# Patient Record
Sex: Male | Born: 1970 | Race: Black or African American | Hispanic: No | Marital: Married | Smoking: Never smoker
Health system: Southern US, Community
[De-identification: ages and names within clinical notes are randomized; demographics above are authoritative.]

---

## 1999-10-06 ENCOUNTER — Emergency Department (HOSPITAL_COMMUNITY): Admission: EM | Admit: 1999-10-06 | Discharge: 1999-10-06 | Payer: Self-pay | Admitting: Emergency Medicine

## 2004-12-23 ENCOUNTER — Ambulatory Visit (HOSPITAL_COMMUNITY): Admission: RE | Admit: 2004-12-23 | Discharge: 2004-12-23 | Payer: Self-pay | Admitting: Sports Medicine

## 2008-04-11 ENCOUNTER — Emergency Department (HOSPITAL_COMMUNITY): Admission: EM | Admit: 2008-04-11 | Discharge: 2008-04-11 | Payer: Self-pay | Admitting: Emergency Medicine

## 2011-01-03 ENCOUNTER — Emergency Department (HOSPITAL_COMMUNITY)
Admission: EM | Admit: 2011-01-03 | Discharge: 2011-01-03 | Disposition: A | Discharge status: Home / Self Care | Payer: Self-pay | Attending: Emergency Medicine | Admitting: Emergency Medicine

## 2011-01-03 DIAGNOSIS — M542 Cervicalgia: Secondary | ICD-10-CM | POA: Insufficient documentation

## 2011-01-03 DIAGNOSIS — M62838 Other muscle spasm: Secondary | ICD-10-CM | POA: Insufficient documentation

## 2011-01-03 DIAGNOSIS — R079 Chest pain, unspecified: Secondary | ICD-10-CM | POA: Insufficient documentation

## 2014-08-29 ENCOUNTER — Ambulatory Visit (INDEPENDENT_AMBULATORY_CARE_PROVIDER_SITE_OTHER): Payer: BLUE CROSS/BLUE SHIELD | Admitting: Family Medicine

## 2014-08-29 VITALS — BP 110/70 | HR 61 | Temp 97.5°F | Resp 17 | Ht 67.5 in | Wt 181.4 lb

## 2014-08-29 DIAGNOSIS — N63 Unspecified lump in unspecified breast: Secondary | ICD-10-CM

## 2014-08-29 NOTE — Patient Instructions (Signed)
We will set up an ultrasound or mammogram for your chest area- we will be in touch with this appointment

## 2014-08-29 NOTE — Progress Notes (Signed)
[facility] [address], [place] [address] [phone]  Date:  08/29/2014   Name:  John Flores   DOB:  03/13/1971   MRN:  [id number]  PCP:  No primary care provider on file.    Chief Complaint: Mass   History of Present Illness:  John KonigGibrilla R Bun is a 44 y.o. very pleasant male patient who presents with the following:  He has noted a fullness or lump in the left breast for the last 5-6 weeks. He does not have any pain, but got concerned about this and wanted to have it looked at.  He is generally in excellent health. Does not take an rx medications.  He is not aware of anything that could have caused this change such as supplements or trauma.    There are no active problems to display for this patient.   History reviewed. No pertinent past medical history.  History reviewed. No pertinent past surgical history.  History  Substance Use Topics  . Smoking status: Never Smoker   . Smokeless tobacco: Not on file  . Alcohol Use: Not on file    History reviewed. No pertinent family history.  Not on File  Medication list has been reviewed and updated.  No current outpatient prescriptions on file prior to visit.   No current facility-administered medications on file prior to visit.    Review of Systems:  As per HPI- otherwise negative.   Physical Examination: Filed Vitals:   08/29/14 1152  BP: 104/58  Pulse: 61  Temp: 97.5 F (36.4 C)  Resp: 17   Filed Vitals:   08/29/14 1152  Height: 5' 7.5" (1.715 m)  Weight: 181 lb 6.4 oz (82.283 kg)   Body mass index is 27.98 kg/(m^2). Ideal Body Weight: Weight in (lb) to have BMI = 25: 161.7  GEN: WDWN, NAD, Non-toxic, A & O x 3, looks well and healthy HEENT: Atraumatic, Normocephalic. Neck supple. No masses, No LAD. Ears and Nose: No external deformity. CV: RRR, No M/G/R. No JVD. No thrill. No extra heart sounds. PULM: CTA B, no wheezes, crackles, rhonchi. No retractions. No resp.  distress. No accessory muscle use. ABD: S, NT, ND. No rebound. No HSM.  EXTR: No c/c/e NEURO Normal gait.  PSYCH: Normally interactive. Conversant. Not depressed or anxious appearing.  Calm demeanor.  Well appearing. Well- muscled chest.  He does have a fuller left chest/ breast than on the right. This fullness seems to be centered under the nipple. There is no hard mass or nodule, no cyst- like structures  Assessment and Plan: Breast mass in male - Plan: MM DIAG BREAST TOMO BILATERAL, US BREAST LTD UNI LEFT INC AXILLA  Ordered mammogram and US in left breast to evaluate further.    Signed Abbe AmsterdamJessica Treasure Ingrum, MD

## 2014-09-25 ENCOUNTER — Ambulatory Visit
Admission: RE | Admit: 2014-09-25 | Discharge: 2014-09-25 | Disposition: A | Discharge status: Home / Self Care | Payer: BLUE CROSS/BLUE SHIELD | Source: Ambulatory Visit | Attending: Family Medicine | Admitting: Family Medicine

## 2014-09-25 DIAGNOSIS — N63 Unspecified lump in unspecified breast: Secondary | ICD-10-CM

## 2014-10-07 ENCOUNTER — Encounter: Payer: Self-pay | Admitting: Family Medicine

## 2014-10-07 ENCOUNTER — Telehealth: Payer: Self-pay | Admitting: Family Medicine

## 2014-10-07 NOTE — Telephone Encounter (Signed)
Called and LMOM- US showed gynecomastia.  Please come and see us for further eval/ labs soon

## 2014-10-09 ENCOUNTER — Ambulatory Visit: Payer: BLUE CROSS/BLUE SHIELD | Admitting: Family Medicine

## 2014-10-16 ENCOUNTER — Ambulatory Visit: Payer: BLUE CROSS/BLUE SHIELD | Admitting: Family Medicine

## 2019-09-19 ENCOUNTER — Ambulatory Visit: Payer: Self-pay | Attending: Internal Medicine

## 2019-09-19 DIAGNOSIS — Z23 Encounter for immunization: Secondary | ICD-10-CM

## 2019-09-19 NOTE — Progress Notes (Signed)
   Covid-19 Vaccination Clinic  Name:  John Flores    MRN: [id number] DOB: 07-01-1970  09/19/2019  [name] was observed post Covid-19 immunization for 15 minutes without incident. He was provided with Vaccine Information Sheet and instruction to access the V-Safe system.   [name] was instructed to call 911 with any severe reactions post vaccine: Marland Kitchen Difficulty breathing  . Swelling of face and throat  . A fast heartbeat  . A bad rash all over body  . Dizziness and weakness   Immunizations Administered    Name Date Dose VIS Date Route   Pfizer COVID-19 Vaccine 09/19/2019 10:38 AM 0.3 mL 05/24/2019 Intramuscular   Manufacturer: [employer], [employer]   Lot: [id number]   NDC: [address]-1

## 2019-10-14 ENCOUNTER — Ambulatory Visit: Payer: Self-pay | Attending: Internal Medicine

## 2020-11-03 ENCOUNTER — Other Ambulatory Visit: Payer: Self-pay

## 2020-11-03 ENCOUNTER — Emergency Department (HOSPITAL_COMMUNITY)
Admission: EM | Admit: 2020-11-03 | Discharge: 2020-11-03 | Disposition: A | Discharge status: Home / Self Care | Payer: PRIVATE HEALTH INSURANCE | Attending: Emergency Medicine | Admitting: Emergency Medicine

## 2020-11-03 ENCOUNTER — Encounter (HOSPITAL_COMMUNITY): Payer: Self-pay

## 2020-11-03 ENCOUNTER — Emergency Department (HOSPITAL_COMMUNITY): Payer: PRIVATE HEALTH INSURANCE

## 2020-11-03 DIAGNOSIS — N179 Acute kidney failure, unspecified: Secondary | ICD-10-CM | POA: Diagnosis not present

## 2020-11-03 DIAGNOSIS — R42 Dizziness and giddiness: Secondary | ICD-10-CM

## 2020-11-03 DIAGNOSIS — I10 Essential (primary) hypertension: Secondary | ICD-10-CM

## 2020-11-03 DIAGNOSIS — N289 Disorder of kidney and ureter, unspecified: Secondary | ICD-10-CM

## 2020-11-03 LAB — BASIC METABOLIC PANEL
Anion gap: 7 (ref 5–15)
BUN: 12 mg/dL (ref 6–20)
CO2: 27 mmol/L (ref 22–32)
Calcium: 9.2 mg/dL (ref 8.9–10.3)
Chloride: 106 mmol/L (ref 98–111)
Creatinine, Ser: 1.43 mg/dL — ABNORMAL HIGH (ref 0.61–1.24)
GFR, Estimated: 60 mL/min — ABNORMAL LOW (ref >60)
Glucose, Bld: 93 mg/dL (ref 70–99)
Potassium: 4.2 mmol/L (ref 3.5–5.1)
Sodium: 140 mmol/L (ref 135–145)

## 2020-11-03 LAB — CBC
HCT: 44.1 % (ref 39.0–52.0)
Hemoglobin: 14.4 g/dL (ref 13.0–17.0)
MCH: 29.1 pg (ref 26.0–34.0)
MCHC: 32.7 g/dL (ref 30.0–36.0)
MCV: 89.1 fL (ref 80.0–100.0)
Platelets: 240 10*3/uL (ref 150–400)
RBC: 4.95 MIL/uL (ref 4.22–5.81)
RDW: 13 % (ref 11.5–15.5)
WBC: 5.8 10*3/uL (ref 4.0–10.5)
nRBC: 0 % (ref 0.0–0.2)

## 2020-11-03 LAB — TSH: TSH: 1.595 u[IU]/mL (ref 0.350–4.500)

## 2020-11-03 MED ORDER — AMLODIPINE BESYLATE 5 MG PO TABS: 5.0000 mg | ORAL_TABLET | Freq: Every day | ORAL | 0 refills | Status: AC

## 2020-11-03 NOTE — ED Triage Notes (Signed)
Patient c/o dizziness when he walks or standing up quickly x 2 days.

## 2020-11-03 NOTE — ED Notes (Signed)
Patient's wife at bedside.

## 2020-11-03 NOTE — ED Provider Notes (Signed)
[facility]-EMERGENCY DEPT Provider Note   CSN: [id number] Arrival date & time: 11/03/20  0818     History Chief Complaint  Patient presents with  . Dizziness    John Flores is a 50 y.o. male.  HPI Patient presents with dizziness.  States he has had for the last 2 or 3 days.  States there is some symptoms started Wednesday but worse around 2 or 3 days ago.  States it is worse when he stands up.  States he feels unsteady and has to walk a little more slow.  Has had headache.  Frontal headache.  This is not unusual for him.  States has had dizziness like this before but has not seen anyone for it.  No relief with Tylenol.  No localizing numbness or weakness.  States sometimes his vision gets a little blurry with it also.  States blood pressure was elevated at home.  States it was 190 systolic.  No known hypertension history.  No chest pain.  No trouble breathing.    History reviewed. No pertinent past medical history.  There are no problems to display for this patient.   History reviewed. No pertinent surgical history.     History reviewed. No pertinent family history.  Social History   Tobacco Use  . Smoking status: Never Smoker  . Smokeless tobacco: Never Used  Vaping Use  . Vaping Use: Never used  Substance Use Topics  . Alcohol use: Never    Alcohol/week: 0.0 standard drinks  . Drug use: Never    Home Medications Prior to Admission medications   Medication Sig Start Date End Date Taking? Authorizing Provider  amLODipine (NORVASC) 5 MG tablet Take 1 tablet (5 mg total) by mouth daily. 11/03/20  Yes Benjiman Core, MD  acetaminophen (TYLENOL) 325 MG tablet Take 325 mg by mouth as needed.    [provider]    Allergies    Patient has no known allergies.  Review of Systems   Review of Systems  Constitutional: Negative for appetite change, fatigue and fever.  HENT: Negative for congestion.   Eyes: Positive for visual  disturbance.  Respiratory: Negative for shortness of breath.   Gastrointestinal: Negative for abdominal distention.  Genitourinary: Negative for flank pain.  Musculoskeletal: Positive for gait problem.  Skin: Negative for rash.  Neurological: Positive for dizziness and headaches.  Psychiatric/Behavioral: Negative for confusion.    Physical Exam Updated Vital Signs BP (!) 136/100   Pulse 63   Temp 98 F (36.7 C) (Oral)   Resp 16   Ht 5\' 9"  (1.753 m)   Wt 88.5 kg   SpO2 100%   BMI 28.80 kg/m   Physical Exam Vitals reviewed.  Constitutional:      Appearance: Normal appearance.  HENT:     Head: Atraumatic.     Nose: Nose normal.     Mouth/Throat:     Mouth: Mucous membranes are moist.  Eyes:     Extraocular Movements: Extraocular movements intact.     Pupils: Pupils are equal, round, and reactive to light.     Comments: No nystagmus  Cardiovascular:     Rate and Rhythm: Normal rate and regular rhythm.  Pulmonary:     Effort: Pulmonary effort is normal.     Breath sounds: No wheezing or rhonchi.  Abdominal:     Tenderness: There is no abdominal tenderness.  Musculoskeletal:        General: No tenderness.     Cervical back:  Neck supple.  Skin:    General: Skin is warm.     Capillary Refill: Capillary refill takes less than 2 seconds.  Neurological:     Mental Status: He is alert and oriented to person, place, and time.     Comments: No nystagmus.  Eye movements intact.  Finger-nose intact bilaterally.  Normal gait.  No Romberg.     ED Results / Procedures / Treatments   Labs (all labs ordered are listed, but only abnormal results are displayed) Labs Reviewed  BASIC METABOLIC PANEL - Abnormal; Notable for the following components:      Result Value   Creatinine, Ser 1.43 (*)    GFR, Estimated 60 (*)    All other components within normal limits  CBC  TSH    EKG EKG Interpretation  Date/Time:  Tuesday Nov 03 2020 08:43:50 EDT Ventricular Rate:  66 PR  Interval:  158 QRS Duration: 88 QT Interval:  406 QTC Calculation: 426 R Axis:   71 Text Interpretation: Sinus rhythm No significant change since last tracing Confirmed by Benjiman Core [phone]) on 11/03/2020 9:13:52 AM   Radiology CT Head Wo Contrast  Result Date: 11/03/2020 CLINICAL DATA:  Acute worsening headache, dizziness EXAM: CT HEAD WITHOUT CONTRAST TECHNIQUE: Contiguous axial images were obtained from the base of the skull through the vertex without intravenous contrast. COMPARISON:  None. FINDINGS: Brain: No evidence of acute infarction, hemorrhage, hydrocephalus, extra-axial collection or mass lesion/mass effect. Vascular: No hyperdense vessel or unexpected calcification. Skull: Normal. Negative for fracture or focal lesion. Sinuses/Orbits: No acute finding. Other: None. IMPRESSION: Normal head CT without contrast for age Electronically Signed   By: Judie Petit.  Shick M.D.   On: 11/03/2020 10:19    Procedures Procedures   Medications Ordered in ED Medications - No data to display  ED Course  I have reviewed the triage vital signs and the nursing notes.  Pertinent labs & imaging results that were available during my care of the patient were reviewed by me and considered in my medical decision making (see chart for details).    MDM Rules/Calculators/A&P                          Patient presents with dizziness and hypertension.  States he feels a little bit off.  States may be a little unsteady at times.  States he has been checking his blood pressure and has been running high.  Has a mild headache when this happens.  States he feels more dizzy when the blood pressure goes up.  He will go back to normal in between.  Worse when he switches positions.  Worse when he stands up.  Benign exam.  Nonfocal.  No nystagmus or Romberg.  Normal gait.  Blood pressure somewhat improved.  Has a history of likely hypertension and feels of but benefit from treatment.  Will give antihypertensives.  Will  need PCP to follow-up with.  Doubt acute cause of dizziness such as stroke.  Creatinine mildly elevated.  Unsure baseline but blood pressure improved and will start antihypertensives. Final Clinical Impression(s) / ED Diagnoses Final diagnoses:  Dizziness  Hypertension, unspecified type  Kidney disease    Rx / DC Orders ED Discharge Orders         Ordered    amLODipine (NORVASC) 5 MG tablet  Daily        11/03/20 1128           Benjiman Core, MD 11/03/20  1652  

## 2020-11-03 NOTE — Discharge Instructions (Signed)
Your kidney function is mildly off with a creatinine of 1.4.  Follow-up with a primary care doctor.  You have been seen previously by [facility] and their information is listed.  We are starting you on some blood pressure medicine

## 2020-11-03 NOTE — ED Notes (Signed)
ED provider at the bedside to evaluate.  

## 2020-11-03 NOTE — ED Notes (Signed)
Provider back at the bedside to evaluate.

## 2021-04-22 ENCOUNTER — Other Ambulatory Visit (HOSPITAL_COMMUNITY): Payer: Self-pay

## 2021-04-22 MED ORDER — AMLODIPINE BESYLATE 10 MG PO TABS
ORAL_TABLET | ORAL | 3 refills | Status: DC
  Filled 2021-04-22 – 2021-05-08 (×2): qty 90, 90d supply, fill #0
  Filled 2021-10-12: qty 90, 90d supply, fill #1

## 2021-05-01 ENCOUNTER — Other Ambulatory Visit (HOSPITAL_COMMUNITY): Payer: Self-pay

## 2021-05-08 ENCOUNTER — Other Ambulatory Visit (HOSPITAL_COMMUNITY): Payer: Self-pay

## 2021-10-13 ENCOUNTER — Other Ambulatory Visit (HOSPITAL_COMMUNITY): Payer: Self-pay

## 2022-02-01 ENCOUNTER — Other Ambulatory Visit (HOSPITAL_COMMUNITY): Payer: Self-pay

## 2022-02-01 MED ORDER — CYCLOBENZAPRINE HCL 5 MG PO TABS
5.0000 mg | ORAL_TABLET | Freq: Three times a day (TID) | ORAL | 0 refills | Status: AC | PRN
  Filled 2022-02-01: qty 30, 10d supply, fill #0

## 2022-06-16 ENCOUNTER — Other Ambulatory Visit (HOSPITAL_COMMUNITY): Payer: Self-pay

## 2022-06-17 ENCOUNTER — Other Ambulatory Visit (HOSPITAL_COMMUNITY): Payer: Self-pay

## 2022-06-17 MED ORDER — AMLODIPINE BESYLATE 10 MG PO TABS
10.0000 mg | ORAL_TABLET | Freq: Every day | ORAL | 3 refills | Status: AC
  Filled 2022-06-17: qty 90, 90d supply, fill #0
  Filled 2022-09-20: qty 30, 30d supply, fill #1
  Filled 2022-10-17: qty 30, 30d supply, fill #2
  Filled 2022-11-29: qty 30, 30d supply, fill #3
  Filled 2023-01-03: qty 30, 30d supply, fill #4
  Filled 2023-01-28: qty 30, 30d supply, fill #5
  Filled 2023-02-27 – 2023-03-11 (×2): qty 30, 30d supply, fill #6
  Filled 2023-04-12 – 2023-05-06 (×3): qty 30, 30d supply, fill #7

## 2022-07-21 DIAGNOSIS — R7309 Other abnormal glucose: Secondary | ICD-10-CM | POA: Diagnosis not present

## 2022-07-21 DIAGNOSIS — Z125 Encounter for screening for malignant neoplasm of prostate: Secondary | ICD-10-CM | POA: Diagnosis not present

## 2022-07-21 DIAGNOSIS — R946 Abnormal results of thyroid function studies: Secondary | ICD-10-CM | POA: Diagnosis not present

## 2022-07-21 DIAGNOSIS — R7989 Other specified abnormal findings of blood chemistry: Secondary | ICD-10-CM | POA: Diagnosis not present

## 2022-07-21 DIAGNOSIS — Z79899 Other long term (current) drug therapy: Secondary | ICD-10-CM | POA: Diagnosis not present

## 2022-07-28 ENCOUNTER — Other Ambulatory Visit (HOSPITAL_COMMUNITY): Payer: Self-pay

## 2022-07-28 DIAGNOSIS — R7303 Prediabetes: Secondary | ICD-10-CM | POA: Diagnosis not present

## 2022-07-28 DIAGNOSIS — I10 Essential (primary) hypertension: Secondary | ICD-10-CM | POA: Diagnosis not present

## 2022-07-28 DIAGNOSIS — Z0000 Encounter for general adult medical examination without abnormal findings: Secondary | ICD-10-CM | POA: Diagnosis not present

## 2022-07-28 DIAGNOSIS — E7800 Pure hypercholesterolemia, unspecified: Secondary | ICD-10-CM | POA: Diagnosis not present

## 2022-07-28 MED ORDER — ROSUVASTATIN CALCIUM 5 MG PO TABS
5.0000 mg | ORAL_TABLET | Freq: Every day | ORAL | 1 refills | Status: DC
  Filled 2022-07-28: qty 30, 30d supply, fill #0
  Filled 2022-08-24: qty 30, 30d supply, fill #1
  Filled 2022-09-26: qty 30, 30d supply, fill #2
  Filled 2022-10-30: qty 30, 30d supply, fill #3
  Filled 2022-11-29: qty 30, 30d supply, fill #4
  Filled 2023-01-03: qty 30, 30d supply, fill #5

## 2022-07-28 MED ORDER — AMLODIPINE BESYLATE 10 MG PO TABS
10.0000 mg | ORAL_TABLET | Freq: Every day | ORAL | 1 refills | Status: DC
  Filled 2022-07-28 – 2023-07-11 (×2): qty 30, 30d supply, fill #0

## 2022-09-12 DIAGNOSIS — E7800 Pure hypercholesterolemia, unspecified: Secondary | ICD-10-CM | POA: Diagnosis not present

## 2022-09-20 ENCOUNTER — Other Ambulatory Visit: Payer: Self-pay

## 2022-10-16 IMAGING — CT CT HEAD W/O CM
3 series · 14 of 45 positions shown, 16 images · non-contrast
Comparison: None.

CLINICAL DATA: Acute worsening headache, dizziness

EXAM:
CT HEAD WITHOUT CONTRAST
TECHNIQUE: Contiguous axial images were obtained from the base of the skull
through the vertex without intravenous contrast.

[Series 2: head wo · axial · 0.47mm/px · z∈[-71,+44]mm · 8 of 28 slices shown, 10 images]
[im 3/28  brain]
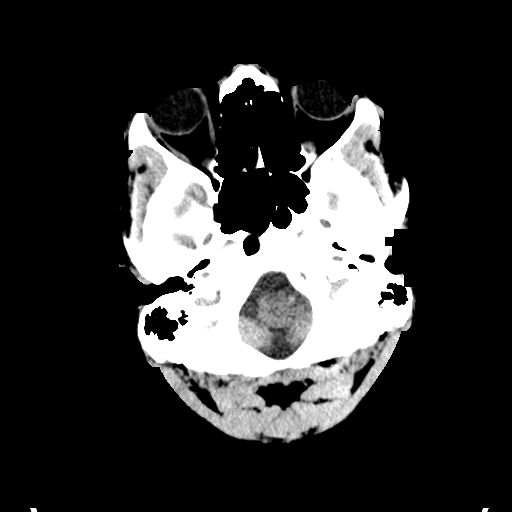
[im 3/28  bone]
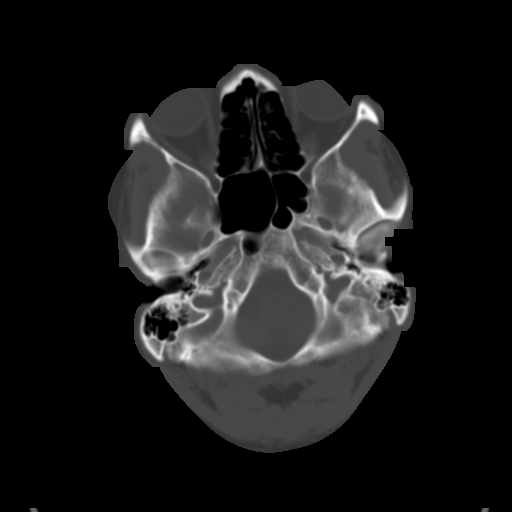
[im 6/28  brain]
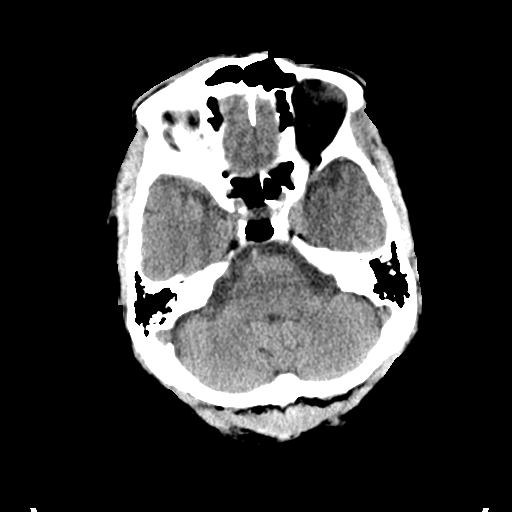
[im 10/28  brain]
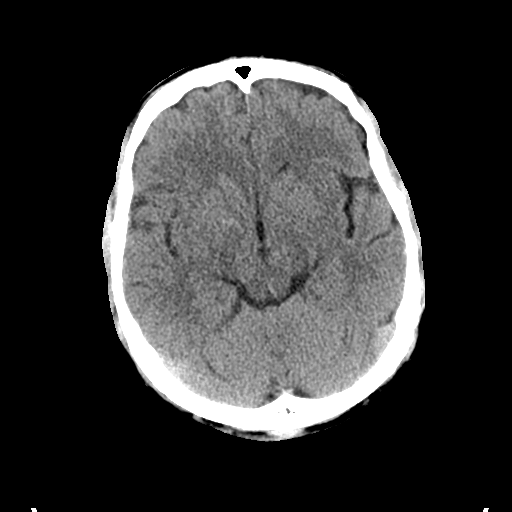
[im 13/28  brain]
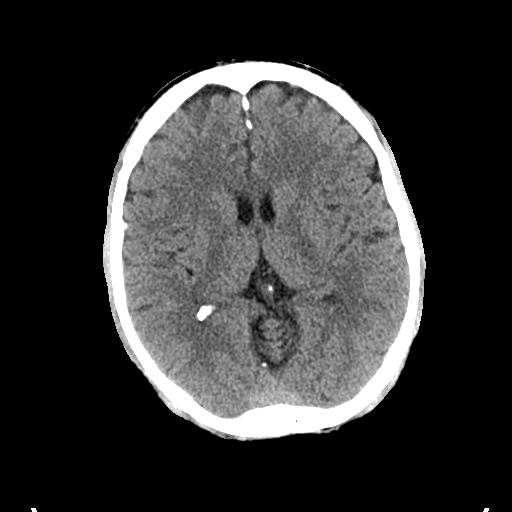
[im 16/28  brain]
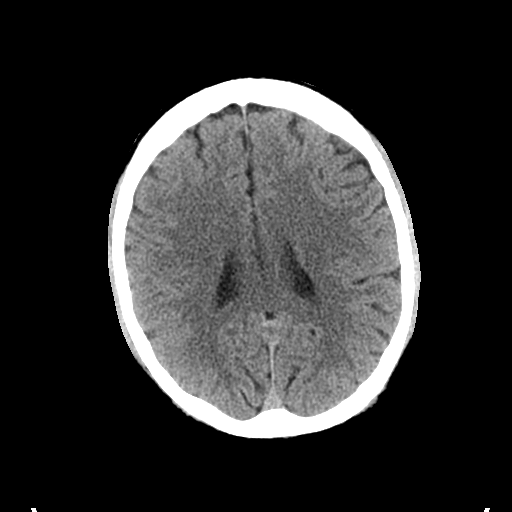
[im 16/28  bone]
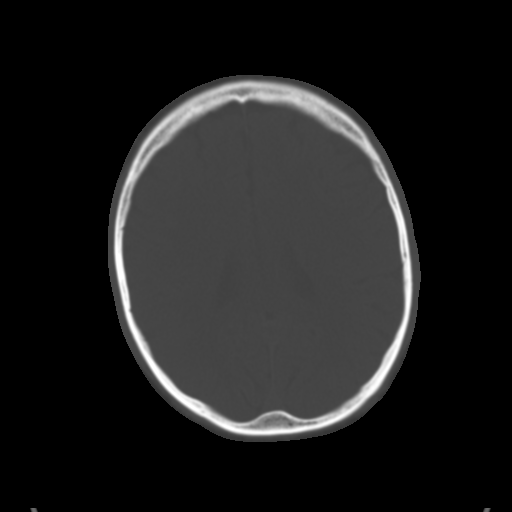
[im 19/28  brain]
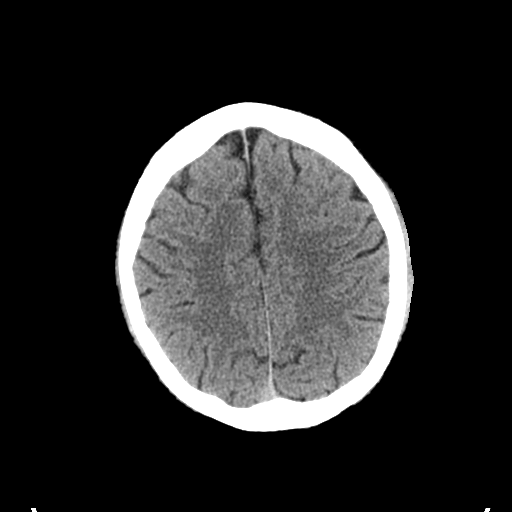
[im 23/28  brain]
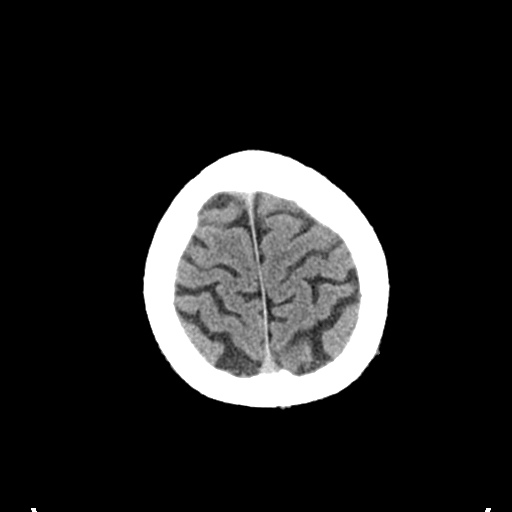
[im 26/28  brain]
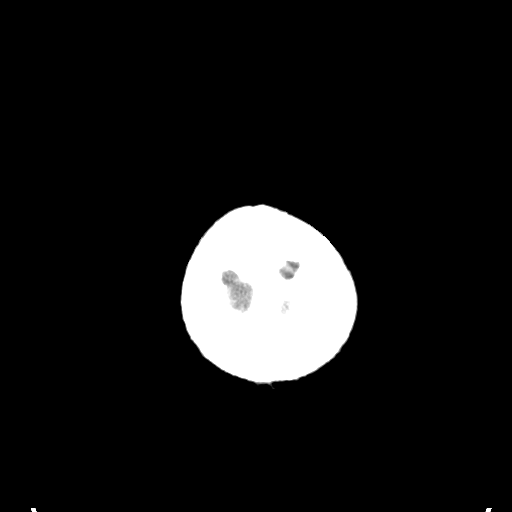

[Series 5: coronal soft tissue · coronal · 0.32mm/px · 3 of 63 slices shown]
[im 21/63  brain]
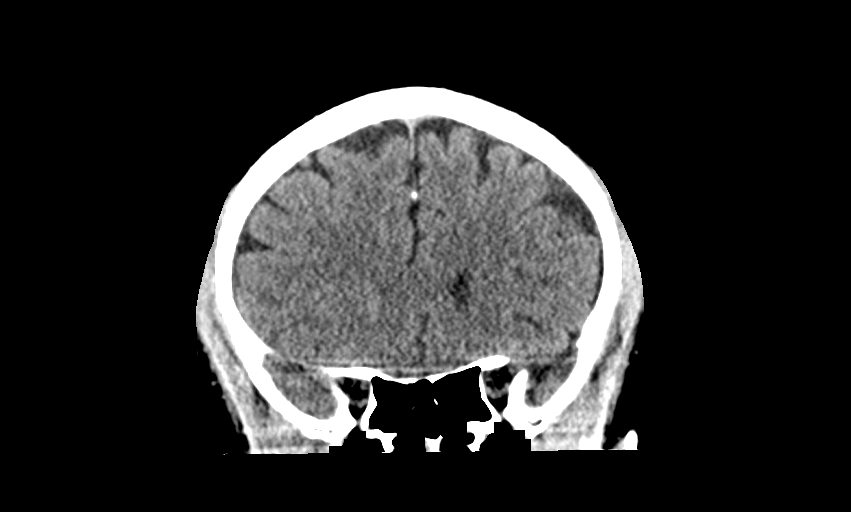
[im 28/63  brain]
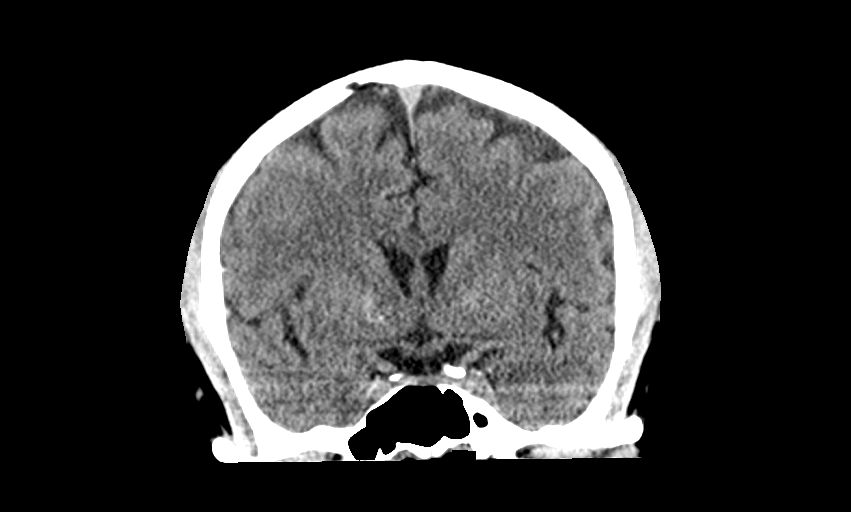
[im 35/63  brain]
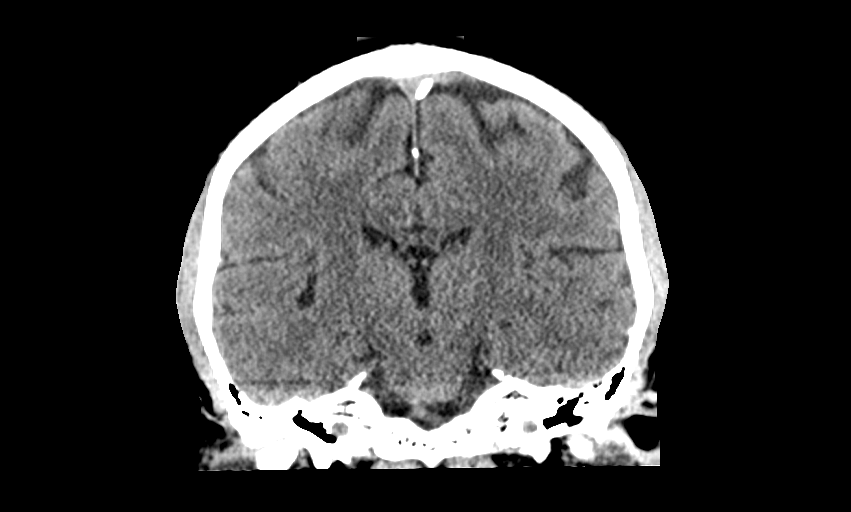

[Series 6: sagittal soft tissue · sagittal · 0.34mm/px · 3 of 53 slices shown]
[im 18/53  brain]
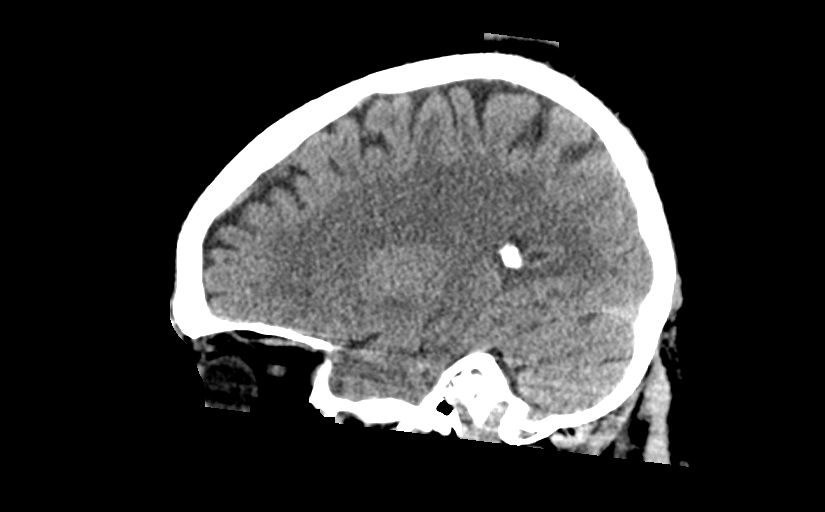
[im 27/53  brain]
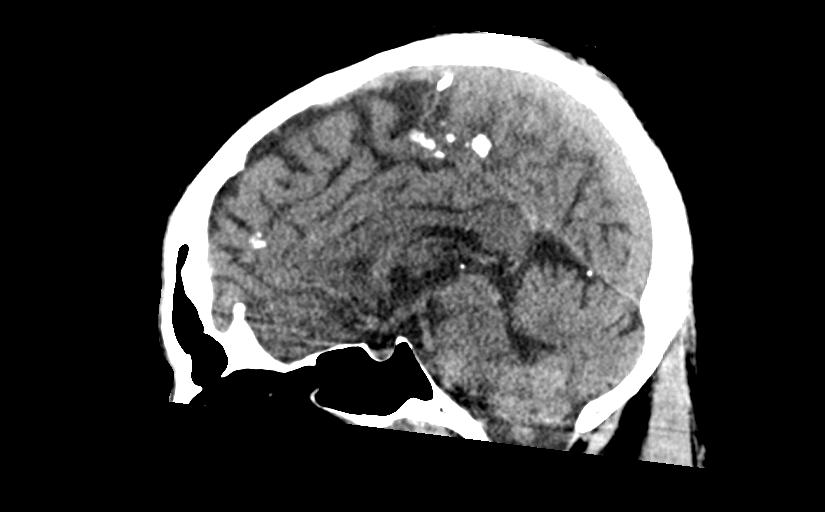
[im 35/53  brain]
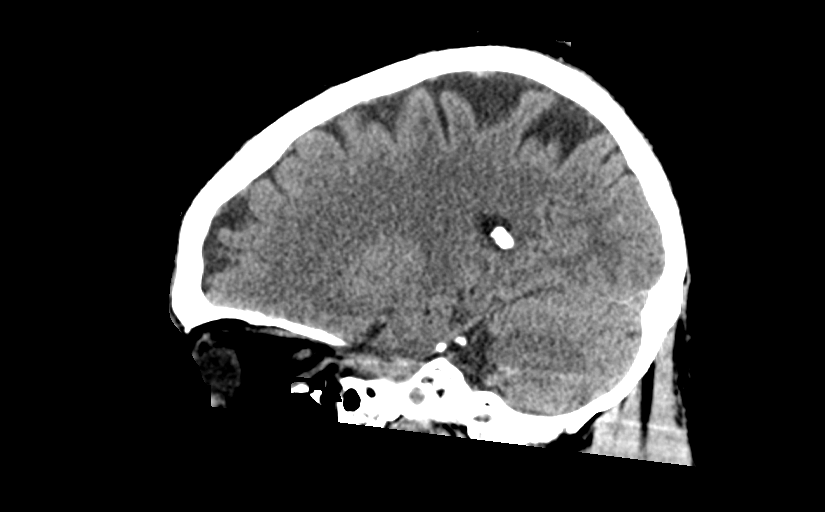

[14 of 45 positions shown; findings below may reference images not displayed]

FINDINGS: Brain: No evidence of acute infarction, hemorrhage, hydrocephalus,
extra-axial collection or mass lesion/mass effect.

Vascular: No hyperdense vessel or unexpected calcification.

Skull: Normal. Negative for fracture or focal lesion.

Sinuses/Orbits: No acute finding.

Other: None.
IMPRESSION: Normal head CT without contrast for age

## 2022-10-17 ENCOUNTER — Other Ambulatory Visit (HOSPITAL_COMMUNITY): Payer: Self-pay

## 2022-11-01 ENCOUNTER — Other Ambulatory Visit (HOSPITAL_COMMUNITY): Payer: Self-pay

## 2022-12-02 ENCOUNTER — Other Ambulatory Visit (HOSPITAL_COMMUNITY): Payer: Self-pay

## 2022-12-08 ENCOUNTER — Other Ambulatory Visit (HOSPITAL_COMMUNITY): Payer: Self-pay

## 2023-01-05 ENCOUNTER — Other Ambulatory Visit (HOSPITAL_COMMUNITY): Payer: Self-pay

## 2023-01-28 ENCOUNTER — Other Ambulatory Visit: Payer: Self-pay

## 2023-01-28 ENCOUNTER — Other Ambulatory Visit (HOSPITAL_COMMUNITY): Payer: Self-pay

## 2023-01-30 ENCOUNTER — Other Ambulatory Visit (HOSPITAL_COMMUNITY): Payer: Self-pay

## 2023-01-30 ENCOUNTER — Other Ambulatory Visit: Payer: Self-pay

## 2023-01-30 MED ORDER — ROSUVASTATIN CALCIUM 5 MG PO TABS
5.0000 mg | ORAL_TABLET | Freq: Every day | ORAL | 1 refills | Status: DC
  Filled 2023-01-30 (×2): qty 30, 30d supply, fill #0
  Filled 2023-02-27 – 2023-03-11 (×2): qty 30, 30d supply, fill #1
  Filled 2023-04-12 – 2023-07-11 (×4): qty 30, 30d supply, fill #2
  Filled 2023-08-10: qty 30, 30d supply, fill #3
  Filled 2023-09-12: qty 30, 30d supply, fill #4
  Filled 2023-10-26: qty 30, 30d supply, fill #5

## 2023-02-03 ENCOUNTER — Other Ambulatory Visit (HOSPITAL_COMMUNITY): Payer: Self-pay

## 2023-03-10 ENCOUNTER — Other Ambulatory Visit (HOSPITAL_COMMUNITY): Payer: Self-pay

## 2023-03-20 ENCOUNTER — Other Ambulatory Visit (HOSPITAL_COMMUNITY): Payer: Self-pay

## 2023-04-24 ENCOUNTER — Other Ambulatory Visit (HOSPITAL_COMMUNITY): Payer: Self-pay

## 2023-05-05 ENCOUNTER — Other Ambulatory Visit (HOSPITAL_COMMUNITY): Payer: Self-pay

## 2023-05-16 ENCOUNTER — Other Ambulatory Visit (HOSPITAL_COMMUNITY): Payer: Self-pay

## 2023-07-11 ENCOUNTER — Other Ambulatory Visit (HOSPITAL_COMMUNITY): Payer: Self-pay

## 2023-07-31 DIAGNOSIS — Z79899 Other long term (current) drug therapy: Secondary | ICD-10-CM | POA: Diagnosis not present

## 2023-07-31 DIAGNOSIS — E7800 Pure hypercholesterolemia, unspecified: Secondary | ICD-10-CM | POA: Diagnosis not present

## 2023-07-31 DIAGNOSIS — Z125 Encounter for screening for malignant neoplasm of prostate: Secondary | ICD-10-CM | POA: Diagnosis not present

## 2023-07-31 DIAGNOSIS — R7989 Other specified abnormal findings of blood chemistry: Secondary | ICD-10-CM | POA: Diagnosis not present

## 2023-07-31 DIAGNOSIS — R7309 Other abnormal glucose: Secondary | ICD-10-CM | POA: Diagnosis not present

## 2023-07-31 DIAGNOSIS — I10 Essential (primary) hypertension: Secondary | ICD-10-CM | POA: Diagnosis not present

## 2023-08-03 ENCOUNTER — Other Ambulatory Visit (HOSPITAL_COMMUNITY): Payer: Self-pay

## 2023-08-03 MED ORDER — AMLODIPINE BESYLATE 10 MG PO TABS
10.0000 mg | ORAL_TABLET | Freq: Every day | ORAL | 1 refills | Status: DC
  Filled 2023-08-03: qty 30, 30d supply, fill #0
  Filled 2023-09-12: qty 30, 30d supply, fill #1
  Filled 2023-10-26: qty 30, 30d supply, fill #2
  Filled 2023-12-19: qty 30, 30d supply, fill #3
  Filled 2024-03-26: qty 30, 30d supply, fill #4
  Filled 2024-05-06: qty 30, 30d supply, fill #5

## 2023-08-07 DIAGNOSIS — E7800 Pure hypercholesterolemia, unspecified: Secondary | ICD-10-CM | POA: Diagnosis not present

## 2023-08-07 DIAGNOSIS — Z0000 Encounter for general adult medical examination without abnormal findings: Secondary | ICD-10-CM | POA: Diagnosis not present

## 2023-08-07 DIAGNOSIS — I10 Essential (primary) hypertension: Secondary | ICD-10-CM | POA: Diagnosis not present

## 2023-08-10 ENCOUNTER — Other Ambulatory Visit (HOSPITAL_COMMUNITY): Payer: Self-pay

## 2023-08-11 ENCOUNTER — Other Ambulatory Visit (HOSPITAL_COMMUNITY): Payer: Self-pay

## 2023-08-15 DIAGNOSIS — Z1212 Encounter for screening for malignant neoplasm of rectum: Secondary | ICD-10-CM | POA: Diagnosis not present

## 2023-08-15 DIAGNOSIS — Z1211 Encounter for screening for malignant neoplasm of colon: Secondary | ICD-10-CM | POA: Diagnosis not present

## 2023-09-06 ENCOUNTER — Other Ambulatory Visit (HOSPITAL_COMMUNITY): Payer: Self-pay

## 2023-09-12 ENCOUNTER — Other Ambulatory Visit (HOSPITAL_COMMUNITY): Payer: Self-pay

## 2023-10-26 ENCOUNTER — Other Ambulatory Visit (HOSPITAL_COMMUNITY): Payer: Self-pay

## 2023-11-18 ENCOUNTER — Other Ambulatory Visit (HOSPITAL_COMMUNITY): Payer: Self-pay

## 2023-11-20 ENCOUNTER — Other Ambulatory Visit (HOSPITAL_COMMUNITY): Payer: Self-pay

## 2023-11-20 MED ORDER — ROSUVASTATIN CALCIUM 5 MG PO TABS
5.0000 mg | ORAL_TABLET | Freq: Every day | ORAL | 3 refills | Status: AC
  Filled 2023-11-20 – 2023-12-18 (×2): qty 30, 30d supply, fill #0
  Filled 2024-03-26: qty 30, 30d supply, fill #1
  Filled 2024-05-06: qty 30, 30d supply, fill #2
  Filled 2024-06-10: qty 30, 30d supply, fill #3
  Filled 2024-06-20: qty 30, 30d supply, fill #4

## 2023-11-30 ENCOUNTER — Other Ambulatory Visit (HOSPITAL_COMMUNITY): Payer: Self-pay

## 2023-12-18 ENCOUNTER — Other Ambulatory Visit (HOSPITAL_COMMUNITY): Payer: Self-pay

## 2023-12-19 ENCOUNTER — Other Ambulatory Visit (HOSPITAL_COMMUNITY): Payer: Self-pay

## 2024-03-26 ENCOUNTER — Other Ambulatory Visit (HOSPITAL_COMMUNITY): Payer: Self-pay

## 2024-05-06 ENCOUNTER — Other Ambulatory Visit (HOSPITAL_COMMUNITY): Payer: Self-pay

## 2024-05-29 ENCOUNTER — Other Ambulatory Visit (HOSPITAL_COMMUNITY): Payer: Self-pay

## 2024-05-30 ENCOUNTER — Other Ambulatory Visit (HOSPITAL_COMMUNITY): Payer: Self-pay

## 2024-05-30 MED FILL — Amlodipine Besylate Tab 10 MG (Base Equivalent): 10.0000 mg | ORAL | 30 days supply | Qty: 30 | Fill #0 | Status: CN

## 2024-06-07 ENCOUNTER — Other Ambulatory Visit (HOSPITAL_COMMUNITY): Payer: Self-pay

## 2024-06-10 ENCOUNTER — Other Ambulatory Visit (HOSPITAL_COMMUNITY): Payer: Self-pay

## 2024-06-10 MED FILL — Amlodipine Besylate Tab 10 MG (Base Equivalent): 10.0000 mg | ORAL | 30 days supply | Qty: 30 | Fill #0 | Status: AC

## 2024-06-10 MED FILL — Amlodipine Besylate Tab 10 MG (Base Equivalent): 10.0000 mg | ORAL | 30 days supply | Qty: 30 | Fill #0 | Status: CN

## 2024-06-20 ENCOUNTER — Other Ambulatory Visit (HOSPITAL_COMMUNITY): Payer: Self-pay

## 2024-06-20 MED FILL — Amlodipine Besylate Tab 10 MG (Base Equivalent): 10.0000 mg | ORAL | 30 days supply | Qty: 30 | Fill #1 | Status: AC

## 2024-07-08 ENCOUNTER — Other Ambulatory Visit (HOSPITAL_COMMUNITY): Payer: Self-pay
